# Patient Record
Sex: Male | Born: 1955 | Race: Black or African American | Hispanic: No | Marital: Single | Smoking: Current some day smoker
Health system: Southern US, Community
[De-identification: ages and names within clinical notes are randomized; demographics above are authoritative.]

## PROBLEM LIST (undated history)

## (undated) DIAGNOSIS — N19 Unspecified kidney failure: Secondary | ICD-10-CM

## (undated) DIAGNOSIS — I10 Essential (primary) hypertension: Secondary | ICD-10-CM

## (undated) DIAGNOSIS — E785 Hyperlipidemia, unspecified: Secondary | ICD-10-CM

## (undated) HISTORY — DX: Hyperlipidemia, unspecified: E78.5

## (undated) HISTORY — PX: ACHILLES TENDON REPAIR: SUR1153

## (undated) HISTORY — DX: Essential (primary) hypertension: I10

---

## 1981-08-13 DIAGNOSIS — N19 Unspecified kidney failure: Secondary | ICD-10-CM

## 1981-08-13 HISTORY — DX: Unspecified kidney failure: N19

## 1998-01-28 ENCOUNTER — Inpatient Hospital Stay (HOSPITAL_COMMUNITY): Admission: EM | Admit: 1998-01-28 | Discharge: 1998-01-29 | Payer: Self-pay | Admitting: *Deleted

## 2003-10-27 ENCOUNTER — Emergency Department (HOSPITAL_COMMUNITY): Admission: EM | Admit: 2003-10-27 | Discharge: 2003-10-27 | Payer: Self-pay | Admitting: Emergency Medicine

## 2006-02-06 ENCOUNTER — Emergency Department (HOSPITAL_COMMUNITY): Admission: EM | Admit: 2006-02-06 | Discharge: 2006-02-06 | Payer: Self-pay | Admitting: Family Medicine

## 2009-09-23 ENCOUNTER — Encounter: Admission: RE | Admit: 2009-09-23 | Discharge: 2009-09-23 | Payer: Self-pay | Admitting: Family Medicine

## 2015-01-03 ENCOUNTER — Encounter (HOSPITAL_COMMUNITY): Payer: Self-pay | Admitting: Family Medicine

## 2015-01-03 ENCOUNTER — Encounter (HOSPITAL_COMMUNITY): Payer: Self-pay | Admitting: Emergency Medicine

## 2015-01-03 ENCOUNTER — Emergency Department (HOSPITAL_COMMUNITY)
Admission: EM | Admit: 2015-01-03 | Discharge: 2015-01-03 | Payer: 59 | Source: Home / Self Care | Attending: Family Medicine | Admitting: Family Medicine

## 2015-01-03 ENCOUNTER — Emergency Department (HOSPITAL_COMMUNITY)
Admission: EM | Admit: 2015-01-03 | Discharge: 2015-01-03 | Disposition: A | Discharge status: Home / Self Care | Payer: 59 | Attending: Emergency Medicine | Admitting: Emergency Medicine

## 2015-01-03 DIAGNOSIS — R339 Retention of urine, unspecified: Secondary | ICD-10-CM | POA: Diagnosis not present

## 2015-01-03 DIAGNOSIS — R300 Dysuria: Secondary | ICD-10-CM | POA: Insufficient documentation

## 2015-01-03 DIAGNOSIS — Z720 Tobacco use: Secondary | ICD-10-CM | POA: Diagnosis not present

## 2015-01-03 DIAGNOSIS — Z87448 Personal history of other diseases of urinary system: Secondary | ICD-10-CM | POA: Insufficient documentation

## 2015-01-03 DIAGNOSIS — R1030 Lower abdominal pain, unspecified: Secondary | ICD-10-CM | POA: Diagnosis not present

## 2015-01-03 DIAGNOSIS — R338 Other retention of urine: Secondary | ICD-10-CM | POA: Diagnosis not present

## 2015-01-03 DIAGNOSIS — R000 Tachycardia, unspecified: Secondary | ICD-10-CM | POA: Insufficient documentation

## 2015-01-03 HISTORY — DX: Unspecified kidney failure: N19

## 2015-01-03 LAB — URINALYSIS, ROUTINE W REFLEX MICROSCOPIC
BILIRUBIN URINE: NEGATIVE
Glucose, UA: NEGATIVE mg/dL
Hgb urine dipstick: NEGATIVE
KETONES UR: NEGATIVE mg/dL
Nitrite: NEGATIVE
PROTEIN: NEGATIVE mg/dL
Specific Gravity, Urine: 1.018 (ref 1.005–1.030)
Urobilinogen, UA: 0.2 mg/dL (ref 0.0–1.0)
pH: 6 (ref 5.0–8.0)

## 2015-01-03 LAB — I-STAT CHEM 8, ED
BUN: 15 mg/dL (ref 6–20)
CHLORIDE: 100 mmol/L — AB (ref 101–111)
CREATININE: 1.4 mg/dL — AB (ref 0.61–1.24)
Calcium, Ion: 1.1 mmol/L — ABNORMAL LOW (ref 1.12–1.23)
GLUCOSE: 107 mg/dL — AB (ref 65–99)
HCT: 48 % (ref 39.0–52.0)
Hemoglobin: 16.3 g/dL (ref 13.0–17.0)
Potassium: 4.1 mmol/L (ref 3.5–5.1)
SODIUM: 135 mmol/L (ref 135–145)
TCO2: 19 mmol/L (ref 0–100)

## 2015-01-03 LAB — URINE MICROSCOPIC-ADD ON

## 2015-01-03 NOTE — ED Notes (Signed)
Pt here for urinary retention since this am. sts only a few drops come out.

## 2015-01-03 NOTE — ED Provider Notes (Signed)
CSN: [id number]     Arrival date & time 01/03/15  1402 History   First MD Initiated Contact with Patient 01/03/15 1418     Chief Complaint  Patient presents with  . Urinary Urgency   (Consider location/radiation/quality/duration/timing/severity/associated sxs/prior Treatment) HPI Comments: 59 year old male presents with a strong urge to urinate but the inability to produce urine. He states that he is having discomfort over the suprapubic area when he strains to urinate. This developed about midmorning today. He was able urinate normally last PM. He states his bladder feels as though it is full. Denies dysuria. He has been having bouts of sweating. No documented fever. He is not taking any medications.   Past Medical History  Diagnosis Date  . Kidney failure 1983   History reviewed. No pertinent past surgical history. History reviewed. No pertinent family history. History  Substance Use Topics  . Smoking status: Current Every Day Smoker -- 0.25 packs/day    Types: Cigarettes  . Smokeless tobacco: Not on file  . Alcohol Use: 4.2 oz/week    7 Cans of beer per week    Review of Systems  Constitutional: Positive for chills and activity change. Negative for fever.  HENT: Negative.   Respiratory: Positive for shortness of breath.   Cardiovascular: Negative.   Gastrointestinal: Positive for abdominal pain.  Genitourinary: Positive for urgency and difficulty urinating. Negative for dysuria, flank pain, discharge, penile swelling, scrotal swelling, penile pain and testicular pain.  Skin: Negative.   Neurological: Negative.     Allergies  Review of patient's allergies indicates no known allergies.  Home Medications   Prior to Admission medications   Medication Sig Start Date End Date Taking? Authorizing Provider  aspirin 81 MG tablet Take 81 mg by mouth daily.   Yes Historical Provider, MD   BP 158/81 mmHg  Pulse 60  Temp(Src) 97.1 F (36.2 C) (Oral)  Resp 16  SpO2  97% Physical Exam  Constitutional: He is oriented to person, place, and time. He appears well-developed and well-nourished. No distress.  Patient is to be in mild-to-moderate distress. He continues to hold his lower abdomen and mid suprapubic area. He is frequently going to bathroom to attempt avoiding every 3-4 minutes.  Cardiovascular: Normal rate, regular rhythm and normal heart sounds.   Pulmonary/Chest: Effort normal. No respiratory distress. He has no wheezes. He has no rales.  Abdominal:  Abdomen is soft above the umbilicus and right and left lateral abdomen. Palpation across the suprapubic area produces significant guarding and tenderness. No enlargement or protrusion suggestive of substantial bladder retention is palpable or visible but palpation produces much guarding and abdominal wall contraction.   Musculoskeletal: He exhibits no edema.  Neurological: He is alert and oriented to person, place, and time. He exhibits normal muscle tone.  Skin: Skin is warm and dry.  Psychiatric: He has a normal mood and affect.  Nursing note and vitals reviewed.   ED Course  Procedures (including critical care time) Labs Review Labs Reviewed - No data to display  Imaging Review No results found.   MDM   1. Acute suprapubic pain, unspecified laterality   2. Acute urinary retention    Transfer to the ED via shuttle for moderate to severe acute suprapubic, lower mid abdominal pain, urinary retention. He has attempted several times to obtain a urine specimen but is only able to provide a few drops.      Hayden Rasmussenavid Kumar Falwell, NP 01/03/15 1445  Hayden Rasmussenavid Jeiden Daughtridge, NP 01/03/15 1446

## 2015-01-03 NOTE — Consult Note (Signed)
Reason for Consult: Urinary Retention, Renal Insufficiency, Urethral Stricture, Prostate Screening  Referring Physician: Leonard Schwartz MD  Kenneth Velazquez is an 59 y.o. male.   HPI:   1 - Urinary Retention / Urethral Stricture - Pt with new urinary retention x 12 hours. Admits to several mos of progressive urgency / weak stream. No h/o GU trauma. ER staff with signifcant resistance noted proximal urethral and emergend Urol consult sought. 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  2 - Renal Insufficiency - Cr 1.4 noted on ER labs on eval urinary retention. Has h/o episode renal failure in 31s. No recent imagign or prior Cr baseline avail for review.   3 -  Prostate Screening - No FHX prostate cancer. No recent PSA's avail for review.   PMH sig for episode renal dysfunction in 1980s of unclear etiology. No current PCP. 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  Today "Kenneth Velazquez" is seen as emergent consult for above.    Past Medical History  Diagnosis Date  . Kidney failure 1983    History reviewed. No pertinent past surgical history.  History reviewed. No pertinent family history.  Social History:  reports that he has been smoking Cigarettes.  He has been smoking about 0.25 packs per day. He does not have any smokeless tobacco history on file. He reports that he drinks about 4.2 oz of alcohol per week. His drug history is not on file.  Allergies: No Known Allergies  Medications: I have reviewed the patient's current medications.  Results for orders placed or performed during the hospital encounter of 01/03/15 (from the past 48 hour(s))  Urinalysis, Routine w reflex microscopic     Status: Abnormal   Collection Time: 01/03/15  4:40 PM  Result Value Ref Range   Color, Urine YELLOW YELLOW   APPearance CLEAR CLEAR   Specific Gravity, Urine 1.018 1.005 - 1.030   pH 6.0 5.0 - 8.0   Glucose, UA NEGATIVE NEGATIVE mg/dL   Hgb urine dipstick  NEGATIVE NEGATIVE   Bilirubin Urine NEGATIVE NEGATIVE   Ketones, ur NEGATIVE NEGATIVE mg/dL   Protein, ur NEGATIVE NEGATIVE mg/dL   Urobilinogen, UA 0.2 0.0 - 1.0 mg/dL   Nitrite NEGATIVE NEGATIVE   Leukocytes, UA TRACE (A) NEGATIVE  Urine microscopic-add on     Status: None   Collection Time: 01/03/15  4:40 PM  Result Value Ref Range   Squamous Epithelial / LPF RARE RARE   WBC, UA 3-6 <3 WBC/hpf   RBC / HPF 0-2 <3 RBC/hpf  I-stat chem 8, ed     Status: Abnormal   Collection Time: 01/03/15  5:14 PM  Result Value Ref Range   Sodium 135 135 - 145 mmol/L   Potassium 4.1 3.5 - 5.1 mmol/L   Chloride 100 (L) 101 - 111 mmol/L   BUN 15 6 - 20 mg/dL   Creatinine, Ser 1.40 (H) 0.61 - 1.24 mg/dL   Glucose, Bld 107 (H) 65 - 99 mg/dL   Calcium, Ion 1.10 (L) 1.12 - 1.23 mmol/L   TCO2 19 0 - 100 mmol/L   Hemoglobin 16.3 13.0 - 17.0 g/dL   HCT 48.0 39.0 - 52.0 %    No results found.  Review of Systems  Constitutional: Negative.  Negative for fever, weight loss and malaise/fatigue.  HENT: Negative.   Eyes: Negative.   Respiratory: Negative.   Gastrointestinal: Negative.   Genitourinary: Positive for dysuria, urgency and frequency. Negative for hematuria and flank pain.  Musculoskeletal: Negative.   Skin: Negative.   Neurological: Negative.   Endo/Heme/Allergies: Negative.    Blood pressure 131/67, pulse 70, temperature 98.6 F (37 C), temperature source Oral, resp. rate 16, height 5' 10.5" (1.791 m), weight 75.297 kg (166 lb), SpO2 95 %. Physical Exam  Constitutional: He appears well-developed.  In ER B18 at [facility], wife at bedside. In visible discomfort.   HENT:  Head: Normocephalic.  Eyes: Pupils are equal, round, and reactive to light.  Neck: Normal range of motion.  Cardiovascular:  Mild regualr tachycardai from pain  Respiratory: Effort normal.  GI: Soft.  Bladder palpably distended and tender.   Genitourinary: Penis normal.  Musculoskeletal: Normal range of motion.   Neurological: He is alert.  Skin: Skin is warm.    BEDSIDE CYSTO / URETHRAL DILATION:  Using aseptic technique flexibel cystourethroscopy performed with 11fflexible scope. Very high-grade proximal pendulous stricutre (pinpoint noted). .Marland Kitchen38 sensor wire advanced over which 28F nephromax balloon dilation aparatus positoined using cysto guidance and inflatted to 22 atm for 90 seconds. Release of stricture then allowed inspection of prostate and bladder. Bladder trabeculated, distended, with some cloudy urine. Complete inspection of all urothelium not perforemd.  65F council catheter then palced over wire to bladder with immediate efflux 1L clear urine and resolution of SP pain. No hematuria.   Assessment/Plan:  1 - Urinary Retention / Urethral Stricture - pt in acute retention due to stricture by cysto. Dilated to approx 28F and 65F foley placed. Plan to keep x approx 10 days then office trial of void. Expalined there is approx 50% risk of recurrence with dilation alone.   2 - Renal Insufficiency - Will need BMP on return to establish true baseline after retention resolved.  3 -  Prostate Screening - Will need PSA / DRE after retention resolved and catheter free as would be artifically abnormal at this time.  4 - OK for DC with foley, we will arrange for GU f/u/ with trial of void. This was outlined to pt and wife who voiced understanding.   Kenneth Velazquez   01/03/2015, 7:06 PM

## 2015-01-03 NOTE — ED Notes (Signed)
Pt will be taken to the ED via our shuttle for Urinary retention and urgency. Pt is stable at this time.  Report was called to the First Nurse Danielle.

## 2015-01-03 NOTE — ED Notes (Signed)
Attempted to place 16 french foley-- without success-- catheter would not pass. Dr. Radford PaxBeaton made aware.

## 2015-01-03 NOTE — Discharge Instructions (Signed)
Acute Urinary Retention °Acute urinary retention is the temporary inability to urinate. °This is a common problem in older men. As men age their prostates become larger and block the flow of urine from the bladder. This is usually a problem that has come on gradually.  °HOME CARE INSTRUCTIONS °If you are sent home with a Foley catheter and a drainage system, you will need to discuss the best course of action with your health care provider. While the catheter is in, maintain a good intake of fluids. Keep the drainage bag emptied and lower than your catheter. This is so that contaminated urine will not flow back into your bladder, which could lead to a urinary tract infection. °There are two main types of drainage bags. One is a large bag that usually is used at night. It has a good capacity that will allow you to sleep through the night without having to empty it. The second type is called a leg bag. It has a smaller capacity, so it needs to be emptied more frequently. However, the main advantage is that it can be attached by a leg strap and can go underneath your clothing, allowing you the freedom to move about or leave your home. °Only take over-the-counter or prescription medicines for pain, discomfort, or fever as directed by your health care provider.  °SEEK MEDICAL CARE IF: °· You develop a low-grade fever. °· You experience spasms or leakage of urine with the spasms. °SEEK IMMEDIATE MEDICAL CARE IF:  °1. You develop chills or fever. °2. Your catheter stops draining urine. °3. Your catheter falls out. °4. You start to develop increased bleeding that does not respond to rest and increased fluid intake. °MAKE SURE YOU: °1. Understand these instructions. °2. Will watch your condition. °3. Will get help right away if you are not doing well or get worse. °Document Released: 11/05/2000 Document Revised: 08/04/2013 Document Reviewed: 01/08/2013 °ExitCare® Patient Information ©2015 [employer]. This information is  not intended to replace advice given to you by your health care provider. Make sure you discuss any questions you have with your health care provider. °Foley Catheter Care °A Foley catheter is a soft, flexible tube that is placed into the bladder to drain urine. A Foley catheter may be inserted if: °· You leak urine or are not able to control when you urinate (urinary incontinence). °· You are not able to urinate when you need to (urinary retention). °· You had prostate surgery or surgery on the genitals. °· You have certain medical conditions, such as multiple sclerosis, dementia, or a spinal cord injury. °If you are going home with a Foley catheter in place, follow the instructions below. °TAKING CARE OF THE CATHETER °5. Wash your hands with soap and water. °6. Using mild soap and warm water on a clean washcloth: °· Clean the area on your body closest to the catheter insertion site using a circular motion, moving away from the catheter. Never wipe toward the catheter because this could sweep bacteria up into the urethra and cause infection. °· Remove all traces of soap. Pat the area dry with a clean towel. For males, reposition the foreskin. °7. Attach the catheter to your leg so there is no tension on the catheter. Use adhesive tape or a leg strap. If you are using adhesive tape, remove any sticky residue left behind by the previous tape you used. °8. Keep the drainage bag below the level of the bladder, but keep it off the floor. °9. Check throughout   the day to be sure the catheter is working and urine is draining freely. Make sure the tubing does not become kinked. °10. Do not pull on the catheter or try to remove it. Pulling could damage internal tissues. °TAKING CARE OF THE DRAINAGE BAGS °You will be given two drainage bags to take home. One is a large overnight drainage bag, and the other is a smaller leg bag that fits underneath clothing. You may wear the overnight bag at any time, but you should never wear  the smaller leg bag at night. Follow the instructions below for how to empty, change, and clean your drainage bags. °Emptying the Drainage Bag °You must empty your drainage bag when it is  -½ full or at least 2-3 times a day. °4. Wash your hands with soap and water. °5. Keep the drainage bag below your hips, below the level of your bladder. This stops urine from going back into the tubing and into your bladder. °6. Hold the dirty bag over the toilet or a clean container. °7. Open the pour spout at the bottom of the bag and empty the urine into the toilet or container. Do not let the pour spout touch the toilet, container, or any other surface. Doing so can place bacteria on the bag, which can cause an infection. °8. Clean the pour spout with a gauze pad or cotton ball that has rubbing alcohol on it. °9. Close the pour spout. °10. Attach the bag to your leg with adhesive tape or a leg strap. °11. Wash your hands well. °Changing the Drainage Bag °Change your drainage bag once a month or sooner if it starts to smell bad or look dirty. Below are steps to follow when changing the drainage bag. °1. Wash your hands with soap and water. °2. Pinch off the rubber catheter so that urine does not spill out. °3. Disconnect the catheter tube from the drainage tube at the connection valve. Do not let the tubes touch any surface. °4. Clean the end of the catheter tube with an alcohol wipe. Use a different alcohol wipe to clean the end of the drainage tube. °5. Connect the catheter tube to the drainage tube of the clean drainage bag. °6. Attach the new bag to the leg with adhesive tape or a leg strap. Avoid attaching the new bag too tightly. °7. Wash your hands well. °Cleaning the Drainage Bag °1. Wash your hands with soap and water. °2. Wash the bag in warm, soapy water. °3. Rinse the bag thoroughly with warm water. °4. Fill the bag with a solution of white vinegar and water (1 cup vinegar to 1 qt warm water [.2 L vinegar to 1 L  warm water]). Close the bag and soak it for 30 minutes in the solution. °5. Rinse the bag with warm water. °6. Hang the bag to dry with the pour spout open and hanging downward. °7. Store the clean bag (once it is dry) in a clean plastic bag. °8. Wash your hands well. °PREVENTING INFECTION °· Wash your hands before and after handling your catheter. °· Take showers daily and wash the area where the catheter enters your body. Do not take baths. Replace wet leg straps with dry ones, if this applies. °· Do not use powders, sprays, or lotions on the genital area. Only use creams, lotions, or ointments as directed by your caregiver. °· For females, wipe from front to back after each bowel movement. °· Drink enough fluids to keep your urine   clear or pale yellow unless you have a fluid restriction. °· Do not let the drainage bag or tubing touch or lie on the floor. °· Wear cotton underwear to absorb moisture and to keep your skin drier. °SEEK MEDICAL CARE IF:  °· Your urine is cloudy or smells unusually bad. °· Your catheter becomes clogged. °· You are not draining urine into the bag or your bladder feels full. °· Your catheter starts to leak. °SEEK IMMEDIATE MEDICAL CARE IF:  °· You have pain, swelling, redness, or pus where the catheter enters the body. °· You have pain in the abdomen, legs, lower back, or bladder. °· You have a fever. °· You see blood fill the catheter, or your urine is pink or red. °· You have nausea, vomiting, or chills. °· Your catheter gets pulled out. °MAKE SURE YOU:  °· Understand these instructions. °· Will watch your condition. °· Will get help right away if you are not doing well or get worse. °Document Released: 07/30/2005 Document Revised: 12/14/2013 Document Reviewed: 07/21/2012 °ExitCare® Patient Information ©2015 [employer]. This information is not intended to replace advice given to you by your health care provider. Make sure you discuss any questions you have with your health care  provider. ° °

## 2015-01-03 NOTE — ED Notes (Signed)
Pt woke up this morning with urgency to urinate but is unable to urinate.  He has pain as well.

## 2015-01-04 LAB — URINE CULTURE
COLONY COUNT: NO GROWTH
Culture: NO GROWTH
Special Requests: NORMAL

## 2015-01-19 NOTE — ED Provider Notes (Signed)
CSN: [id number]     Arrival date & time 01/03/15  1501 History   First MD Initiated Contact with Patient 01/03/15 1554     Chief Complaint  Patient presents with  . Urinary Retention      HPI Patient presents with 24-48 hour history of inability to urinate normally.  No fever no chills.  Has been having to strain to urinate.  His urinary out put in stream has been decreasing over the last several weeks.  Otherwise patient is taking no medication and has no significant past medical history. Past Medical History  Diagnosis Date  . Kidney failure 1983   History reviewed. No pertinent past surgical history. History reviewed. No pertinent family history. History  Substance Use Topics  . Smoking status: Current Every Day Smoker -- 0.25 packs/day    Types: Cigarettes  . Smokeless tobacco: Not on file  . Alcohol Use: 4.2 oz/week    7 Cans of beer per week    Review of Systems  All other systems reviewed and are negative  Allergies  Review of patient's allergies indicates no known allergies.  Home Medications   Prior to Admission medications   Medication Sig Start Date End Date Taking? Authorizing Provider  aspirin 81 MG tablet Take 81 mg by mouth daily.   Yes Historical Provider, MD   BP 128/75 mmHg  Pulse 74  Temp(Src) 98.6 F (37 C) (Oral)  Resp 16  Ht 5' 10.5" (1.791 m)  Wt 166 lb (75.297 kg)  BMI 23.47 kg/m2  SpO2 95% Physical Exam  Constitutional: He is oriented to person, place, and time. He appears well-developed and well-nourished. No distress.  HENT:  Head: Normocephalic and atraumatic.  Eyes: Pupils are equal, round, and reactive to light.  Neck: Normal range of motion.  Cardiovascular: Normal rate and intact distal pulses.   Pulmonary/Chest: No respiratory distress.  Abdominal: Normal appearance. He exhibits distension. There is tenderness.    Musculoskeletal: Normal range of motion.  Neurological: He is alert and oriented to person, place, and time. No  cranial nerve deficit.  Skin: Skin is warm and dry. No rash noted.  Psychiatric: He has a normal mood and affect. His behavior is normal.  Nursing note and vitals reviewed.   ED Course  BLADDER CATHETERIZATION Date/Time: 01/03/2015 6:00 PM Performed by: Nelva Nay Authorized by: Nelva Nay Consent: Verbal consent obtained. Risks and benefits: risks, benefits and alternatives were discussed Consent given by: patient Patient identity confirmed: verbally with patient and arm band Indications: urinary retention Local anesthesia used: no Patient sedated: no Preparation: Patient was prepped and draped in the usual sterile fashion. Catheter type: Foley Catheter size: 16 Fr Complicated insertion: yes (After several attempts there was no urine return and was unable to pass a catheter) Number of attempts: 3 Urine volume: 0 ml Comments: Will consult urology   (including critical care time)   Labs Review Labs Reviewed  URINALYSIS, ROUTINE W REFLEX MICROSCOPIC - Abnormal; Notable for the following:    Leukocytes, UA TRACE (*)    All other components within normal limits  I-STAT CHEM 8, ED - Abnormal; Notable for the following:    Chloride 100 (*)    Creatinine, Ser 1.40 (*)    Glucose, Bld 107 (*)    Calcium, Ion 1.10 (*)    All other components within normal limits  URINE CULTURE  URINE MICROSCOPIC-ADD ON    Imaging Review No results found.   EKG Interpretation None     Urology  was consulted and came to the emergency department to place catheter.  Catheter was successfully placed and urology arrange for follow-up. MDM   Final diagnoses:  Urinary retention        Nelva Nayobert Swayzie Choate, MD 01/19/15 1622

## 2020-05-04 ENCOUNTER — Other Ambulatory Visit: Payer: Self-pay | Admitting: Family Medicine

## 2020-05-04 ENCOUNTER — Ambulatory Visit
Admission: RE | Admit: 2020-05-04 | Discharge: 2020-05-04 | Disposition: A | Discharge status: Home / Self Care | Payer: 59 | Source: Ambulatory Visit | Attending: Family Medicine | Admitting: Family Medicine

## 2020-05-04 ENCOUNTER — Other Ambulatory Visit: Payer: Self-pay

## 2020-05-04 DIAGNOSIS — R52 Pain, unspecified: Secondary | ICD-10-CM

## 2021-03-23 ENCOUNTER — Other Ambulatory Visit: Payer: Self-pay | Admitting: Family Medicine

## 2021-03-23 DIAGNOSIS — R519 Headache, unspecified: Secondary | ICD-10-CM

## 2021-03-24 ENCOUNTER — Ambulatory Visit
Admission: RE | Admit: 2021-03-24 | Discharge: 2021-03-24 | Disposition: A | Discharge status: Home / Self Care | Payer: 59 | Source: Ambulatory Visit | Attending: Family Medicine | Admitting: Family Medicine

## 2021-03-24 DIAGNOSIS — R519 Headache, unspecified: Secondary | ICD-10-CM

## 2022-02-01 ENCOUNTER — Other Ambulatory Visit: Payer: Self-pay | Admitting: Cardiology

## 2022-02-01 ENCOUNTER — Encounter: Payer: Self-pay | Admitting: Cardiology

## 2022-02-01 ENCOUNTER — Telehealth: Payer: Self-pay | Admitting: Cardiology

## 2022-02-01 ENCOUNTER — Ambulatory Visit: Payer: 59 | Admitting: Cardiology

## 2022-02-01 VITALS — BP 161/90 | HR 55 | Temp 98.5°F | Resp 16 | Ht 70.0 in | Wt 174.2 lb

## 2022-02-01 DIAGNOSIS — R072 Precordial pain: Secondary | ICD-10-CM

## 2022-02-01 DIAGNOSIS — E7800 Pure hypercholesterolemia, unspecified: Secondary | ICD-10-CM

## 2022-02-01 DIAGNOSIS — I10 Essential (primary) hypertension: Secondary | ICD-10-CM

## 2022-02-01 MED ORDER — HYDROCHLOROTHIAZIDE 25 MG PO TABS: 25.0000 mg | ORAL_TABLET | Freq: Every morning | ORAL | 0 refills | Status: AC

## 2022-02-01 NOTE — Telephone Encounter (Signed)
Patient instructed at checkout to make an appointment at [facility]. Says they don't have anything available until August. Patient wanted to verify this is ok, and can we move his follow up to after that appointment?

## 2022-02-01 NOTE — Progress Notes (Signed)
ID:  Kenneth Velazquez, DOB July 21, 1956, MRN [id number]  PCP:  Patient, No Pcp Per  Cardiologist:  Tessa Lerner, DO, [facility] (established care 02/01/2022)  REASON FOR CONSULT: Chest pain  REQUESTING PHYSICIAN:  Mirna Mires, MD [address] [place],  [place] [address]  Chief Complaint  Patient presents with   Chest Pain   New Patient (Initial Visit)    HPI  Kenneth Velazquez is a 66 y.o. African-American male who presents to the clinic for evaluation of chest pain at the request of Mirna Mires, MD. His past medical history and cardiovascular risk factors include: Cigarette smoker, hyperlipidemia.  Chest pain: Patient had a cardial discomfort about 2.5 weeks ago, located anteriorly on both right and left side, intensity 8 out of 10, nonradiating, the discomfort lasted all day until he went to sleep.  He followed up with his PCP and now referred to cardiology for further evaluation.  The discomfort was nonexertional, and did not resolve with rest.  Patient stated that when he does physical activity the chest pain was actually getting better but more noticeable when he was sitting at his desk at work.  He was given sublingual nitroglycerin tablets but has not used them.   Patient intermittently gets headaches currently asymptomatic.  Patient does not check his blood pressures at home.  Patient states that he does have some anterior precordial discomfort, shortness of breath, and complains of PND.  Started blood pressure pills about 2 weeks ago and statin therapy less than a week ago.  No family history of premature coronary artery disease, sudden cardiac death, or cardiomyopathy.  Patient is a longtime smoker but is in the process of quitting, currently smokes 3 cigarettes/day.  FUNCTIONAL STATUS: Plays golf 2-3 x per week.    ALLERGIES: No Known Allergies  MEDICATION LIST PRIOR TO VISIT: Current Meds  Medication Sig   amLODipine (NORVASC) 5 MG tablet Take 5 mg by mouth daily.    aspirin 81 MG tablet Take 81 mg by mouth daily.   hydrochlorothiazide (HYDRODIURIL) 25 MG tablet Take 1 tablet (25 mg total) by mouth every morning.   nitroGLYCERIN (NITROSTAT) 0.4 MG SL tablet Place under the tongue.   rosuvastatin (CRESTOR) 10 MG tablet Take 10 mg by mouth at bedtime.     PAST MEDICAL HISTORY: Past Medical History:  Diagnosis Date   Hyperlipidemia    Hypertension    Kidney failure 08/13/1981    PAST SURGICAL HISTORY: Past Surgical History:  Procedure Laterality Date   ACHILLES TENDON REPAIR      FAMILY HISTORY: The patient family history includes Cancer in his father and mother; Hypertension in his mother.  SOCIAL HISTORY:  The patient  reports that he has been smoking cigarettes. He has been smoking an average of .25 packs per day. He does not have any smokeless tobacco history on file. He reports current alcohol use of about 7.0 standard drinks of alcohol per week. He reports that he does not use drugs.  REVIEW OF SYSTEMS: Review of Systems  Cardiovascular:  Positive for chest pain and paroxysmal nocturnal dyspnea. Negative for dyspnea on exertion, leg swelling, orthopnea, palpitations and syncope.  Respiratory:  Positive for shortness of breath.     PHYSICAL EXAM:    02/01/2022    9:06 AM 02/01/2022    9:05 AM 01/03/2015    7:45 PM  Vitals with BMI  Height  5\' 10"    Weight  174 lbs 3 oz   BMI  25   Systolic 161 159 196  Diastolic 90 91 75  Pulse 55 55 74    CONSTITUTIONAL: Well-developed and well-nourished. No acute distress.  SKIN: Skin is warm and dry. No rash noted. No cyanosis. No pallor. No jaundice HEAD: Normocephalic and atraumatic.  EYES: No scleral icterus MOUTH/THROAT: Moist oral membranes.  NECK: No JVD present. No thyromegaly noted. No carotid bruits  CHEST Normal respiratory effort. No intercostal retractions  LUNGS: Clear to auscultation bilaterally.  No stridor. No wheezes. No rales.  CARDIOVASCULAR: Regular rate and rhythm,  positive S1-S2, no murmurs rubs or gallops appreciated. ABDOMINAL: Soft, nontender, nondistended, positive bowel sounds in all 4 quadrants, no apparent ascites.  EXTREMITIES: No peripheral edema, warm to touch, 2+ bilateral DP and PT pulses HEMATOLOGIC: No significant bruising NEUROLOGIC: Oriented to person, place, and time. Nonfocal. Normal muscle tone.  PSYCHIATRIC: Normal mood and affect. Normal behavior. Cooperative  CARDIAC DATABASE: EKG: 02/01/2022: 56 bpm, sinus bradycardia, normal axis, without underlying ischemia or injury pattern.  Echocardiogram: No results found for this or any previous visit from the past 1095 days.    Stress Testing: No results found for this or any previous visit from the past 1095 days.   Heart Catheterization: None  LABORATORY DATA:    Latest Ref Rng & Units 01/03/2015    5:14 PM  CBC  Hemoglobin 13.0 - 17.0 g/dL 22.2   Hematocrit 97.9 - 52.0 % 48.0        Latest Ref Rng & Units 01/03/2015    5:14 PM  CMP  Glucose 65 - 99 mg/dL 892   BUN 6 - 20 mg/dL 15   Creatinine 1.19 - 1.24 mg/dL 4.17   Sodium 408 - 144 mmol/L 135   Potassium 3.5 - 5.1 mmol/L 4.1   Chloride 101 - 111 mmol/L 100     Lipid Panel  No results found for: "CHOL", "TRIG", "HDL", "CHOLHDL", "VLDL", "LDLCALC", "LDLDIRECT", "LABVLDL"  No components found for: "NTPROBNP" No results for input(s): "PROBNP" in the last 8760 hours. No results for input(s): "TSH" in the last 8760 hours.  BMP No results for input(s): "NA", "K", "CL", "CO2", "GLUCOSE", "BUN", "CREATININE", "CALCIUM", "GFRNONAA", "GFRAA" in the last 8760 hours.  HEMOGLOBIN A1C No results found for: "HGBA1C", "MPG"  External Labs: Collected: 01/15/2022 Total cholesterol 243, HDL 69, triglycerides 196, LDL 140, non-HDL 174 BUN 15, creatinine 1.17. Sodium 137, potassium 4.4, chloride 104, bicarb 25, AST 15, ALT 14, alkaline phosphatase 44  IMPRESSION:    ICD-10-CM   1. Precordial pain  R07.2 EKG 12-Lead     PCV ECHOCARDIOGRAM COMPLETE    CT CARDIAC SCORING ([facility] LOCATIONS ONLY)    PCV CARDIAC STRESS TEST    2. Pure hypercholesterolemia  E78.00     3. Benign hypertension  I10 hydrochlorothiazide (HYDRODIURIL) 25 MG tablet    Basic metabolic panel    Magnesium       RECOMMENDATIONS: Kenneth Velazquez is a 66 y.o. African-American male whose past medical history and cardiac risk factors include: Cigarette smoker, hyperlipidemia.  Precordial pain Symptoms suggestive of noncardiac etiology. Given his risk factors recommend coronary artery calcification score, echocardiogram, and exercise treadmill stress test Educated on seeking medical attention sooner by going to the closest ER via EMS if the symptoms increase in intensity, frequency, duration, or has typical chest pain as discussed in the office.  Patient verbalized understanding.  Pure hypercholesterolemia Most recent LDL 140 mg/dL. Started on Crestor 10 mg p.o. nightly recently by his PCP. Recommend  follow-up blood work in 6 weeks to evaluate lipid profile and LFTs.  Will defer to PCP  Benign hypertension Office blood pressure not well controlled. Started on antihypertensive medications approximately 2 weeks ago. We will start hydrochlorothiazide 25 mg p.o. every morning as the patient does not have a PCP appointment in the near future and his blood pressures are too high.  Reemphasized the importance of a low-salt diet. Labs in 1 week to evaluate kidney function and electrolytes. We will defer long-term management of blood pressure to PCP.  As part of this consultation reviewed outside records provided by referring physician including office note, EKG, labs.  These were independently reviewed and summarized above.  Today's EKG ordered and independently reviewed as noted above.  And ordered additional diagnostic work-up as well as pharmacological therapy for reasons mentioned above  FINAL MEDICATION LIST END OF ENCOUNTER: Meds ordered  this encounter  Medications   hydrochlorothiazide (HYDRODIURIL) 25 MG tablet    Sig: Take 1 tablet (25 mg total) by mouth every morning.    Dispense:  90 tablet    Refill:  0    There are no discontinued medications.   Current Outpatient Medications:    amLODipine (NORVASC) 5 MG tablet, Take 5 mg by mouth daily., Disp: , Rfl:    aspirin 81 MG tablet, Take 81 mg by mouth daily., Disp: , Rfl:    hydrochlorothiazide (HYDRODIURIL) 25 MG tablet, Take 1 tablet (25 mg total) by mouth every morning., Disp: 90 tablet, Rfl: 0   nitroGLYCERIN (NITROSTAT) 0.4 MG SL tablet, Place under the tongue., Disp: , Rfl:    rosuvastatin (CRESTOR) 10 MG tablet, Take 10 mg by mouth at bedtime., Disp: , Rfl:   Orders Placed This Encounter  Procedures   CT CARDIAC SCORING ([facility] LOCATIONS ONLY)   Basic metabolic panel   Magnesium   PCV CARDIAC STRESS TEST   EKG 12-Lead   PCV ECHOCARDIOGRAM COMPLETE    There are no Patient Instructions on file for this visit.   --Continue cardiac medications as reconciled in final medication list. --Return in about 4 weeks (around 03/01/2022) for Reevaluation of, Chest pain, Review test results. or sooner if needed. --Continue follow-up with your primary care physician regarding the management of your other chronic comorbid conditions.  Patient's questions and concerns were addressed to his satisfaction. He voices understanding of the instructions provided during this encounter.   This note was created using a voice recognition software as a result there may be grammatical errors inadvertently enclosed that do not reflect the nature of this encounter. Every attempt is made to correct such errors.  Tessa Lerner, [place], [facility]  Pager: [phone] Office: [phone]

## 2022-02-01 NOTE — Telephone Encounter (Signed)
Its a calcium score can it be done elsewhere?  ST

## 2022-02-02 ENCOUNTER — Ambulatory Visit: Payer: 59

## 2022-02-02 DIAGNOSIS — R072 Precordial pain: Secondary | ICD-10-CM

## 2022-02-02 LAB — BASIC METABOLIC PANEL
BUN/Creatinine Ratio: 10 (ref 10–24)
BUN: 14 mg/dL (ref 8–27)
CO2: 24 mmol/L (ref 20–29)
Calcium: 9.2 mg/dL (ref 8.6–10.2)
Chloride: 103 mmol/L (ref 96–106)
Creatinine, Ser: 1.34 mg/dL — ABNORMAL HIGH (ref 0.76–1.27)
Glucose: 94 mg/dL (ref 70–99)
Potassium: 4.4 mmol/L (ref 3.5–5.2)
Sodium: 138 mmol/L (ref 134–144)
eGFR: 59 mL/min/{1.73_m2} — ABNORMAL LOW (ref >59)

## 2022-02-02 LAB — MAGNESIUM: Magnesium: 2 mg/dL (ref 1.6–2.3)

## 2022-02-07 ENCOUNTER — Other Ambulatory Visit: Payer: Self-pay

## 2022-02-08 ENCOUNTER — Other Ambulatory Visit: Payer: Self-pay

## 2022-02-08 DIAGNOSIS — I10 Essential (primary) hypertension: Secondary | ICD-10-CM

## 2022-02-08 DIAGNOSIS — R072 Precordial pain: Secondary | ICD-10-CM

## 2022-02-08 NOTE — Progress Notes (Signed)
Called and spoke to patient he voiced understanding is going to get them done next week

## 2022-02-08 NOTE — Progress Notes (Signed)
Called patient, NA, LMAM

## 2022-02-09 NOTE — Telephone Encounter (Signed)
Has patient been scheduled for his test?

## 2022-02-12 NOTE — Telephone Encounter (Signed)
See message above

## 2022-02-14 NOTE — Progress Notes (Signed)
Called, NA, wasn't able to leave a VM.

## 2022-02-15 LAB — BASIC METABOLIC PANEL
BUN/Creatinine Ratio: 16 (ref 10–24)
BUN: 22 mg/dL (ref 8–27)
CO2: 22 mmol/L (ref 20–29)
Calcium: 9.4 mg/dL (ref 8.6–10.2)
Chloride: 101 mmol/L (ref 96–106)
Creatinine, Ser: 1.38 mg/dL — ABNORMAL HIGH (ref 0.76–1.27)
Glucose: 104 mg/dL — ABNORMAL HIGH (ref 70–99)
Potassium: 4.2 mmol/L (ref 3.5–5.2)
Sodium: 139 mmol/L (ref 134–144)
eGFR: 57 mL/min/{1.73_m2} — ABNORMAL LOW (ref >59)

## 2022-02-15 LAB — MAGNESIUM: Magnesium: 2.7 mg/dL — ABNORMAL HIGH (ref 1.6–2.3)

## 2022-02-15 NOTE — Telephone Encounter (Signed)
That's fine. Please confirm that its available in careeverywhere.   Dr. Odis Hollingshead

## 2022-02-16 NOTE — Progress Notes (Signed)
Called and spoke with patient regarding his stress test results. He will do his best to log his BP's because he does not have a machine, he will rely on going to any pharmacy to check his BP.

## 2022-02-16 NOTE — Progress Notes (Signed)
Called pt, NA, VM was full.

## 2022-02-19 NOTE — Progress Notes (Signed)
Called pt, wasn't able to leave a VM.

## 2022-02-20 ENCOUNTER — Ambulatory Visit: Payer: 59

## 2022-02-20 DIAGNOSIS — R072 Precordial pain: Secondary | ICD-10-CM

## 2022-02-21 NOTE — Progress Notes (Signed)
Patient is aware and will bring all medications to his next OV.

## 2022-02-26 NOTE — Progress Notes (Signed)
Called pt and he is aware of echo results.

## 2022-03-01 ENCOUNTER — Encounter: Payer: Self-pay | Admitting: Cardiology

## 2022-03-01 ENCOUNTER — Ambulatory Visit: Payer: 59 | Admitting: Cardiology

## 2022-03-01 VITALS — BP 148/76 | HR 57 | Temp 98.2°F | Resp 16 | Ht 70.0 in | Wt 174.8 lb

## 2022-03-01 DIAGNOSIS — I10 Essential (primary) hypertension: Secondary | ICD-10-CM

## 2022-03-01 DIAGNOSIS — E7800 Pure hypercholesterolemia, unspecified: Secondary | ICD-10-CM

## 2022-03-01 DIAGNOSIS — R0602 Shortness of breath: Secondary | ICD-10-CM

## 2022-03-01 DIAGNOSIS — R072 Precordial pain: Secondary | ICD-10-CM

## 2022-03-01 MED ORDER — AMLODIPINE BESYLATE 10 MG PO TABS: 10.0000 mg | ORAL_TABLET | Freq: Every day | ORAL | 1 refills | Status: AC

## 2022-03-01 NOTE — Progress Notes (Signed)
ID:  Kenneth Velazquez, DOB 10-Aug-1956, MRN [id number]  PCP:  Parke Simmers, Clinic  Cardiologist:  Tessa Lerner, DO, [facility] (established care 02/01/2022)  Date: 03/01/22 Last Office Visit: 6/22/2023se  Chief Complaint  Patient presents with   Chest Pain   Results   Follow-up    4 weeks    HPI  Kenneth Velazquez is a 66 y.o. African-American male whose  past medical history and cardiovascular risk factors include: Cigarette smoker, hyperlipidemia.  Patient initially presented to the office for evaluation of chest pain.  Based on symptomatology predominantly noncardiac but given his risk factors patient underwent a coronary calcium score, GXT, and echocardiogram.  Results reviewed with him in great detail and noted below for further reference.  Clinically he no longer is experiencing precordial pain.  He was started on antihypertensive medications at the last office visit and his home blood pressures have improved significantly.  SBP range between 130-145 mmHg and DBP between 80-90 mmHg and pulse 55-70 bpm.   Patient is also making a conscious effort with regards to complete smoking cessation.  At the last visit he was smoking 3 cigarettes/day and now down to 1-2 cigarettes/day.  He is congratulated on his efforts.  FUNCTIONAL STATUS: Plays golf 2-3 x per week.    ALLERGIES: No Known Allergies  MEDICATION LIST PRIOR TO VISIT: Current Meds  Medication Sig   amLODipine (NORVASC) 10 MG tablet Take 1 tablet (10 mg total) by mouth daily at 10 pm.   aspirin 81 MG tablet Take 81 mg by mouth daily.   hydrochlorothiazide (HYDRODIURIL) 25 MG tablet Take 1 tablet (25 mg total) by mouth every morning.   nitroGLYCERIN (NITROSTAT) 0.4 MG SL tablet Place under the tongue.   rosuvastatin (CRESTOR) 10 MG tablet Take 10 mg by mouth at bedtime.   [DISCONTINUED] amLODipine (NORVASC) 5 MG tablet Take 5 mg by mouth daily.     PAST MEDICAL HISTORY: Past Medical History:  Diagnosis Date   Hyperlipidemia     Hypertension    Kidney failure 08/13/1981    PAST SURGICAL HISTORY: Past Surgical History:  Procedure Laterality Date   ACHILLES TENDON REPAIR      FAMILY HISTORY: The patient family history includes Cancer in his father and mother; Hypertension in his mother.  SOCIAL HISTORY:  The patient  reports that he has been smoking cigarettes. He has a 11.25 pack-year smoking history. He does not have any smokeless tobacco history on file. He reports current alcohol use of about 7.0 standard drinks of alcohol per week. He reports that he does not use drugs.  REVIEW OF SYSTEMS: Review of Systems  Cardiovascular:  Positive for dyspnea on exertion and paroxysmal nocturnal dyspnea. Negative for chest pain, leg swelling, orthopnea, palpitations and syncope.  Respiratory:  Positive for shortness of breath.    PHYSICAL EXAM:    03/01/2022    9:05 AM 02/01/2022    9:06 AM 02/01/2022    9:05 AM  Vitals with BMI  Height 5\' 10"   5\' 10"   Weight 174 lbs 13 oz  174 lbs 3 oz  BMI 25.08  25  Systolic 148 161 408  Diastolic 76 90 91  Pulse 57 55 55    CONSTITUTIONAL: Well-developed and well-nourished. No acute distress.  SKIN: Skin is warm and dry. No rash noted. No cyanosis. No pallor. No jaundice HEAD: Normocephalic and atraumatic.  EYES: No scleral icterus MOUTH/THROAT: Moist oral membranes.  NECK: No JVD present. No thyromegaly noted. No carotid bruits  CHEST Normal respiratory effort. No intercostal retractions  LUNGS: Clear to auscultation bilaterally.  No stridor. No wheezes. No rales.  CARDIOVASCULAR: Regular rate and rhythm, positive S1-S2, no murmurs rubs or gallops appreciated. ABDOMINAL: Soft, nontender, nondistended, positive bowel sounds in all 4 quadrants, no apparent ascites.  EXTREMITIES: No peripheral edema, warm to touch, 2+ bilateral DP and PT pulses HEMATOLOGIC: No significant bruising NEUROLOGIC: Oriented to person, place, and time. Nonfocal. Normal muscle tone.   PSYCHIATRIC: Normal mood and affect. Normal behavior. Cooperative  CARDIAC DATABASE: EKG: 02/01/2022: 56 bpm, sinus bradycardia, normal axis, without underlying ischemia or injury pattern.  Echocardiogram: 02/20/2022: Normal LV systolic function with visual EF 60-65%. Left ventricle cavity is normal in size. Normal left ventricular wall thickness. Normal global wall motion. Normal diastolic filling pattern, normal LAP.  No significant valvular heart disease. No prior study for comparison.   Stress Testing: Exercise treadmill stress test 02/02/2022: Exercise treadmill stress test performed using Bruce protocol. Patient reached 9.4 METS, and 87% of age predicted maximum heart rate. Exercise capacity was good. No chest pain reported. Normal heart rate response. Hypertensive exercise response, peak BP 220/80 mmHg.  Stress EKG revealed no ischemic changes. Low risk study.  Heart Catheterization: None  CT Cardiac Scoring: Performed at [facility] 02/06/2022. Total CAC 0 AU. Noncardiac findings: 1.9 x 1.3 cm hepatic cyst  LABORATORY DATA:    Latest Ref Rng & Units 01/03/2015    5:14 PM  CBC  Hemoglobin 13.0 - 17.0 g/dL 63.7   Hematocrit 85.8 - 52.0 % 48.0        Latest Ref Rng & Units 02/14/2022   11:23 AM 02/01/2022   12:13 PM 01/03/2015    5:14 PM  CMP  Glucose 70 - 99 mg/dL 850  94  277   BUN 8 - 27 mg/dL 22  14  15    Creatinine 0.76 - 1.27 mg/dL  4.12  8.78   Sodium 134 - 144 mmol/L 139  138  135   Potassium 3.5 - 5.2 mmol/L 4.2  4.4  4.1   Chloride 96 - 106 mmol/L 101  103  100   CO2 20 - 29 mmol/L 22  24    Calcium 8.6 - 10.2 mg/dL 9.4  9.2      Lipid Panel  No results found for: "CHOL", "TRIG", "HDL", "CHOLHDL", "VLDL", "LDLCALC", "LDLDIRECT", "LABVLDL"  No components found for: "NTPROBNP" No results for input(s): "PROBNP" in the last 8760 hours. No results for input(s): "TSH" in the last 8760 hours.  BMP Recent Labs    02/01/22 1213 02/14/22 1123  NA  138 139  K 4.4 4.2  CL 103 101  CO2 24 22  GLUCOSE 94 104*  BUN 14 22  CREATININE 1.34* 1.38*  CALCIUM 9.2 9.4    HEMOGLOBIN A1C No results found for: "HGBA1C", "MPG"  External Labs: Collected: 01/15/2022 Total cholesterol 243, HDL 69, triglycerides 196, LDL 140, non-HDL 174 BUN 15, creatinine 1.17. Sodium 137, potassium 4.4, chloride 104, bicarb 25, AST 15, ALT 14, alkaline phosphatase 44  IMPRESSION:    ICD-10-CM   1. Precordial pain  R07.2     2. Benign hypertension  I10 amLODipine (NORVASC) 10 MG tablet    3. Pure hypercholesterolemia  E78.00     4. Shortness of breath  R06.02        RECOMMENDATIONS: KENDRIK MCSHAN is a 66 y.o. African-American male whose past medical history and cardiac risk factors include: Cigarette smoker, hyperlipidemia.  Precordial pain  Precordial pain resolved. Echocardiogram: Preserved LVEF, normal diastolic function, no significant valvular heart disease. GXT: Low risk study except hypertensive response to exercise. Coronary calcium score: 0 Educated on importance of improving his modifiable cardiovascular risk factors such as blood pressure management, lipid management and complete cessation of nicotine.  Benign hypertension Improved. Initially SBP would be around 160 mmHg or greater. Based on a home blood pressure log SBP ranges between 130-145 mmHg. Increase amlodipine to 10 mg p.o. daily. Further medication titration will be deferred to PCP. We emphasized the importance of a low-salt diet  Pure hypercholesterolemia Currently on Crestor 10 mg p.o. nightly. He has been on statin medications for more than 6 weeks.  Recommend repeating fasting lipid profile.  Patient will follow-up with PCP  Shortness of breath Would like to focus on better blood pressure management for now. However if the symptoms continue despite adequate blood pressure would recommend pulmonary evaluation given his history of smoking. Clinically euvolemic and  no sequela's of heart failure.  On the coronary calcium score he was noted to have incidental finding of the liver likely a hepatic cyst per report.  I have asked him to discuss this further and possible work-up with PCP as well.  FINAL MEDICATION LIST END OF ENCOUNTER: Meds ordered this encounter  Medications   amLODipine (NORVASC) 10 MG tablet    Sig: Take 1 tablet (10 mg total) by mouth daily at 10 pm.    Dispense:  90 tablet    Refill:  1    Medications Discontinued During This Encounter  Medication Reason   amLODipine (NORVASC) 5 MG tablet Dose change     Current Outpatient Medications:    amLODipine (NORVASC) 10 MG tablet, Take 1 tablet (10 mg total) by mouth daily at 10 pm., Disp: 90 tablet, Rfl: 1   aspirin 81 MG tablet, Take 81 mg by mouth daily., Disp: , Rfl:    hydrochlorothiazide (HYDRODIURIL) 25 MG tablet, Take 1 tablet (25 mg total) by mouth every morning., Disp: 90 tablet, Rfl: 0   nitroGLYCERIN (NITROSTAT) 0.4 MG SL tablet, Place under the tongue., Disp: , Rfl:    rosuvastatin (CRESTOR) 10 MG tablet, Take 10 mg by mouth at bedtime., Disp: , Rfl:   No orders of the defined types were placed in this encounter.   There are no Patient Instructions on file for this visit.   --Continue cardiac medications as reconciled in final medication list. --Return in about 1 year (around 03/02/2023) for Annual Follow up . or sooner if needed. --Continue follow-up with your primary care physician regarding the management of your other chronic comorbid conditions.  Patient's questions and concerns were addressed to his satisfaction. He voices understanding of the instructions provided during this encounter.   This note was created using a voice recognition software as a result there may be grammatical errors inadvertently enclosed that do not reflect the nature of this encounter. Every attempt is made to correct such errors.  Tessa Lerner, [place], [facility]  Pager: [phone] Office:  [phone]

## 2022-03-20 NOTE — Progress Notes (Signed)
Patient returned our call he said he has not heard from his pcp I told him to try to get in contact with them.

## 2022-03-20 NOTE — Progress Notes (Signed)
Called pt no answer and could not leave a vm 

## 2022-03-22 ENCOUNTER — Other Ambulatory Visit: Payer: 59

## 2022-04-09 ENCOUNTER — Other Ambulatory Visit: Payer: Self-pay | Admitting: Family Medicine

## 2022-04-09 DIAGNOSIS — Z720 Tobacco use: Secondary | ICD-10-CM

## 2022-04-18 ENCOUNTER — Ambulatory Visit
Admission: RE | Admit: 2022-04-18 | Discharge: 2022-04-18 | Disposition: A | Discharge status: Home / Self Care | Payer: 59 | Source: Ambulatory Visit | Attending: Family Medicine | Admitting: Family Medicine

## 2022-04-18 DIAGNOSIS — Z720 Tobacco use: Secondary | ICD-10-CM

## 2022-09-17 IMAGING — CR DG HUMERUS 2V *R*
2 series · 2 of 2 positions shown · non-contrast
Comparison: None.

CLINICAL DATA: Right shoulder pain for 2 months

EXAM:
RIGHT HUMERUS - 2+ VIEW

[w humerus ap right]
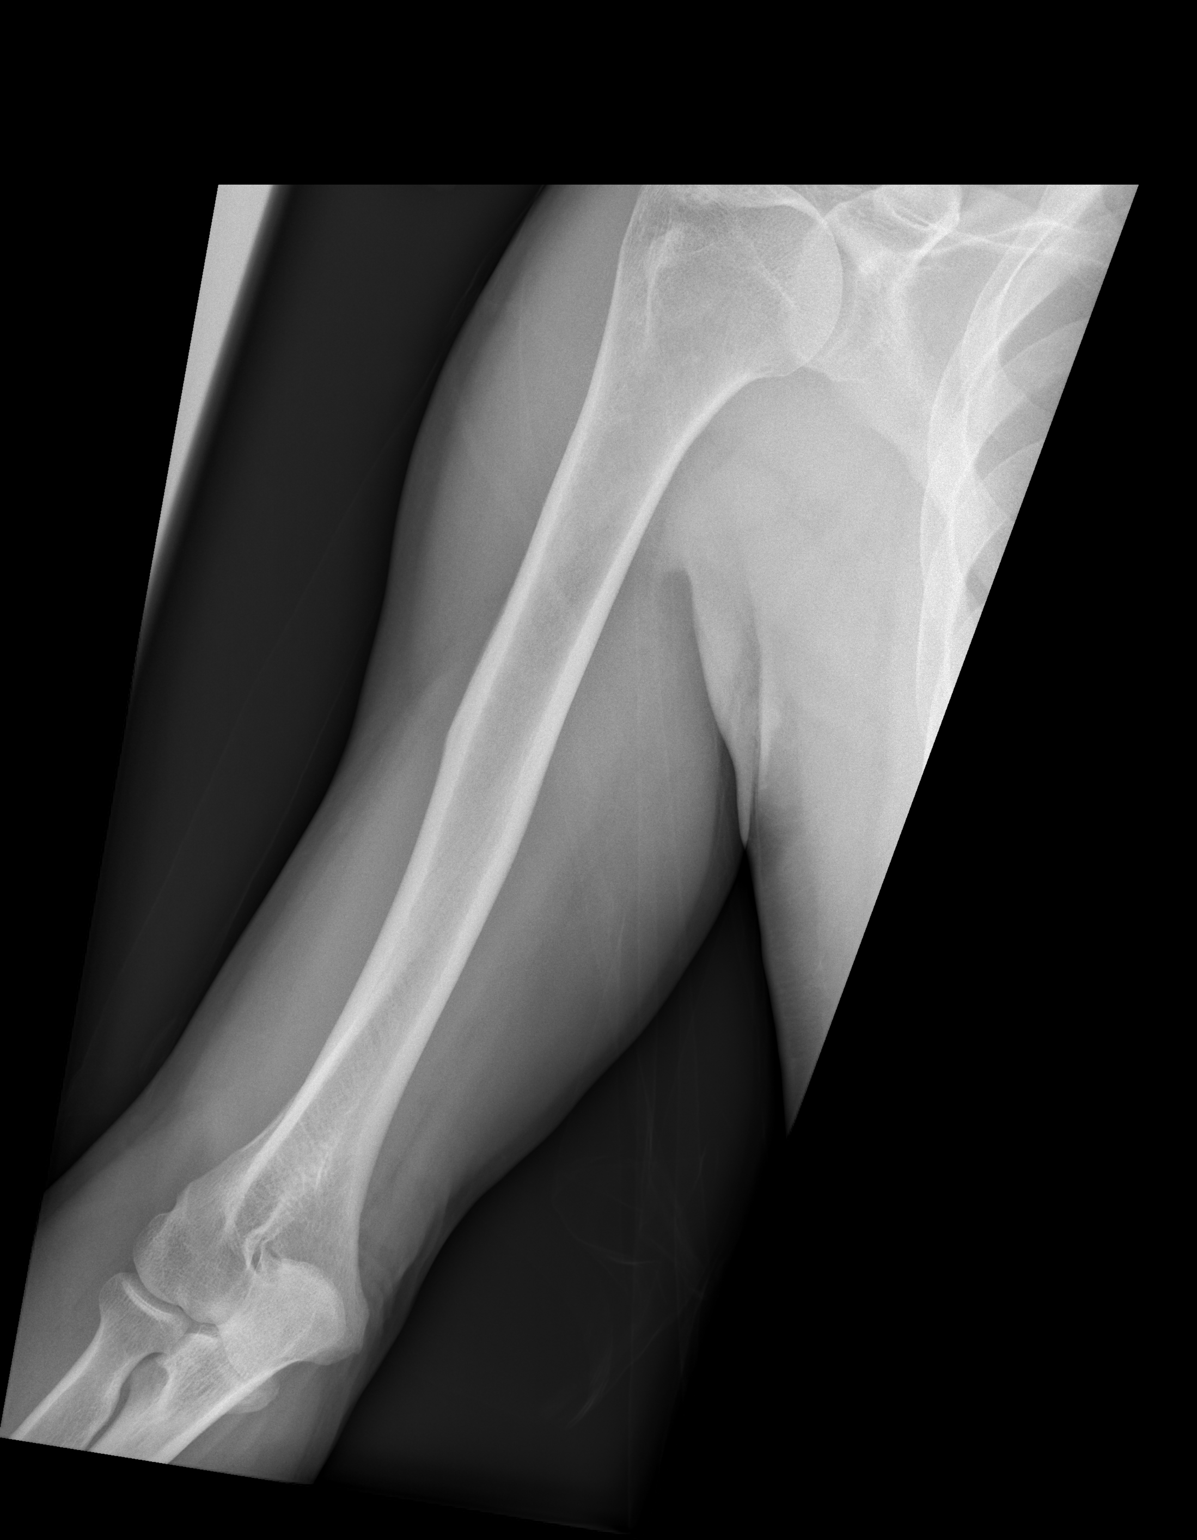

[w humerus lat right]
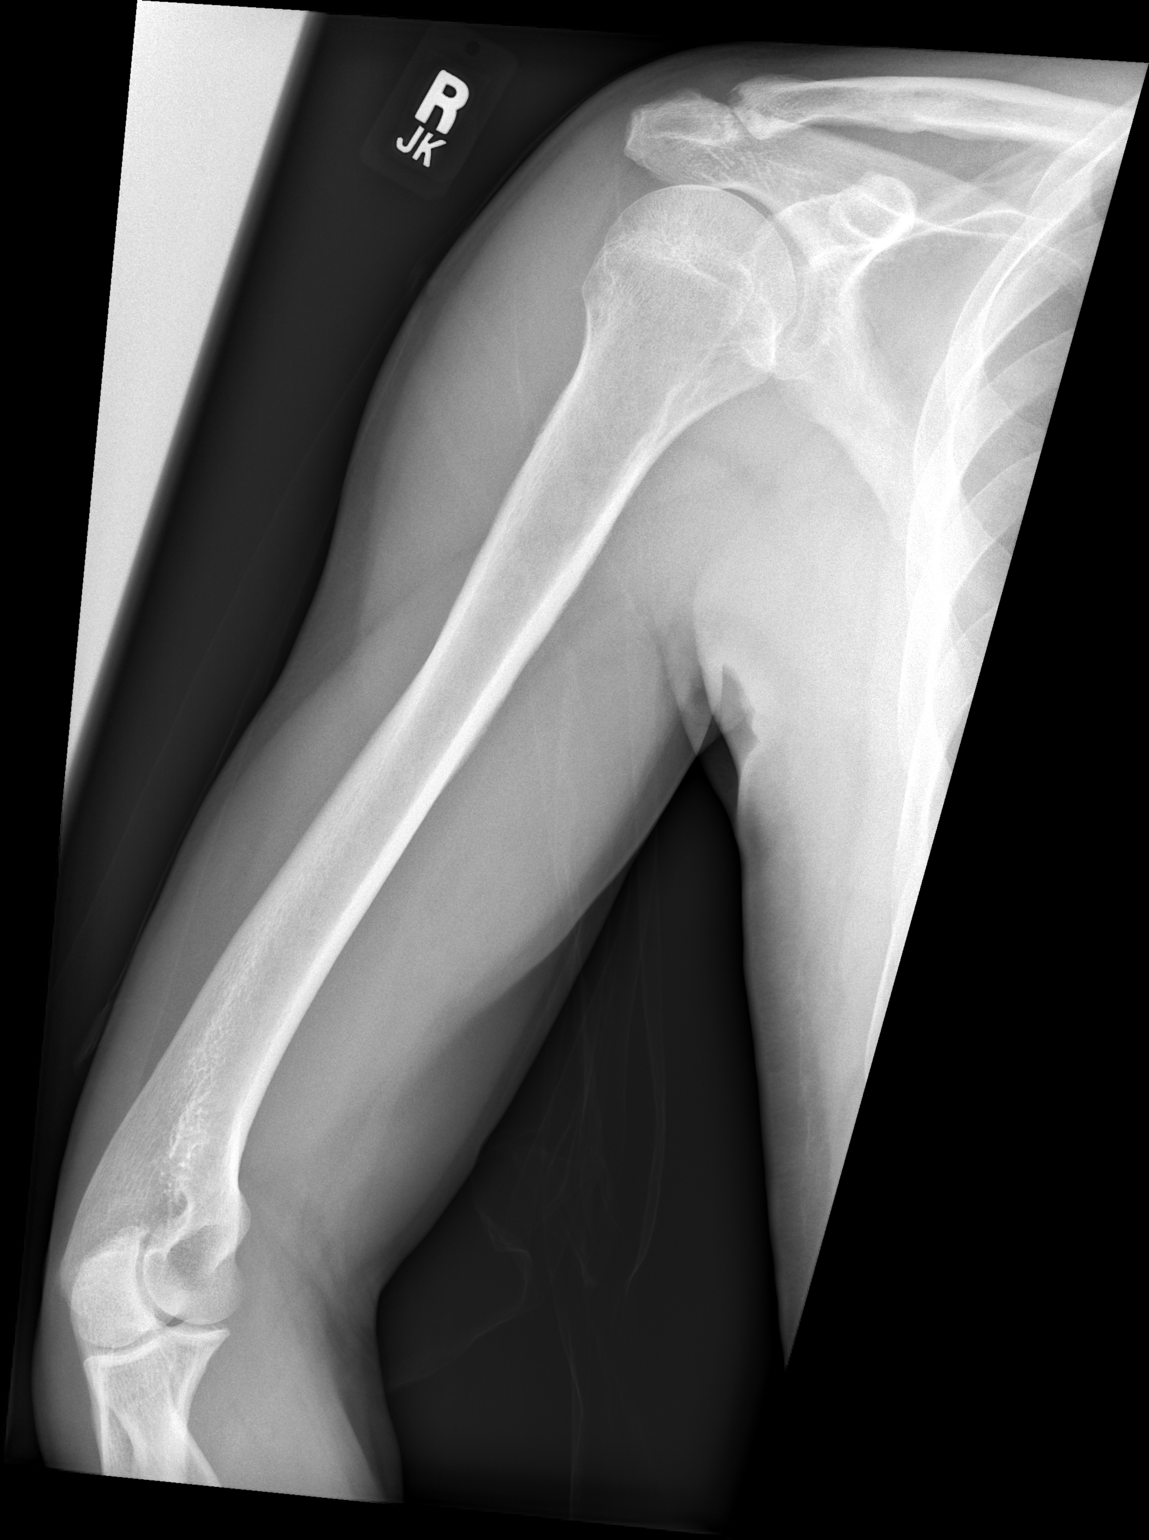

[2 of 2 positions shown; findings below may reference images not displayed]

FINDINGS: Frontal and lateral views of the right humerus demonstrate no
fractures. Alignment is anatomic. Moderate hypertrophic changes of
the acromioclavicular joint. Glenohumeral joint is well preserved.
The right elbow is unremarkable. Soft tissues are normal.
IMPRESSION: 1. Moderate acromioclavicular joint osteoarthritis.
2. Otherwise unremarkable right humerus.

## 2023-03-04 ENCOUNTER — Ambulatory Visit: Payer: Self-pay | Admitting: Cardiology

## 2023-04-09 ENCOUNTER — Other Ambulatory Visit: Payer: Self-pay | Admitting: Cardiology

## 2023-04-09 DIAGNOSIS — I10 Essential (primary) hypertension: Secondary | ICD-10-CM

## 2023-06-10 DIAGNOSIS — J439 Emphysema, unspecified: Secondary | ICD-10-CM | POA: Diagnosis not present

## 2023-06-10 DIAGNOSIS — R634 Abnormal weight loss: Secondary | ICD-10-CM | POA: Diagnosis not present

## 2023-06-10 DIAGNOSIS — F5221 Male erectile disorder: Secondary | ICD-10-CM | POA: Diagnosis not present

## 2023-06-10 DIAGNOSIS — Z720 Tobacco use: Secondary | ICD-10-CM | POA: Diagnosis not present

## 2023-06-10 DIAGNOSIS — I700 Atherosclerosis of aorta: Secondary | ICD-10-CM | POA: Diagnosis not present

## 2023-06-10 DIAGNOSIS — E782 Mixed hyperlipidemia: Secondary | ICD-10-CM | POA: Diagnosis not present

## 2023-06-10 DIAGNOSIS — I10 Essential (primary) hypertension: Secondary | ICD-10-CM | POA: Diagnosis not present

## 2023-08-07 IMAGING — CT CT HEAD W/O CM
4 series · 16 of 47 positions shown, 18 images · non-contrast
Comparison: None.

CLINICAL DATA: Fall from ladder.  Trauma to head.

EXAM:
CT HEAD WITHOUT CONTRAST
TECHNIQUE: Contiguous axial images were obtained from the base of the skull
through the vertex without intravenous contrast.

[Series 2: head 5.00 hr40 s3 axial ibhc · axial · 0.47mm/px · z∈[-677,-552]mm · 6 of 35 slices shown, 8 images]
[im 5/35  brain]
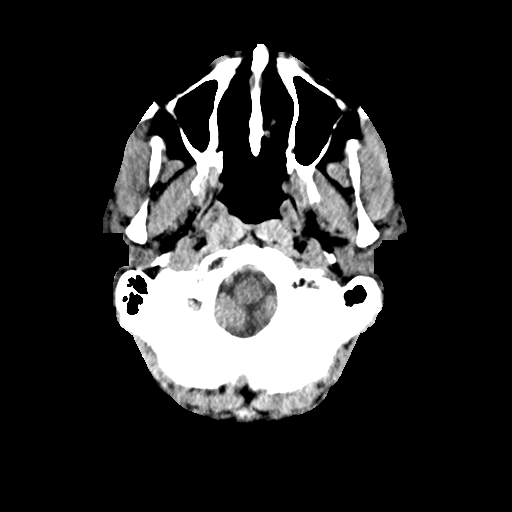
[im 5/35  bone]
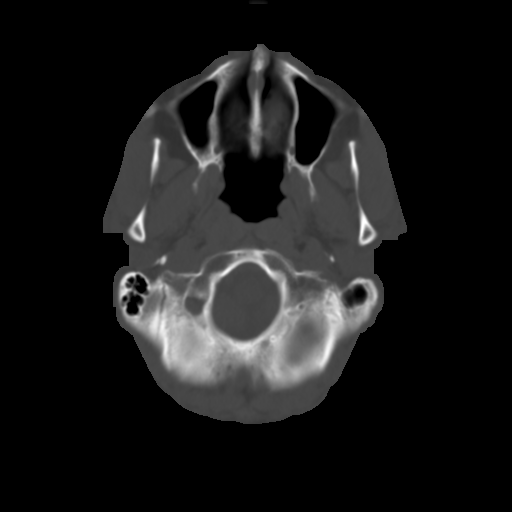
[im 10/35  brain]
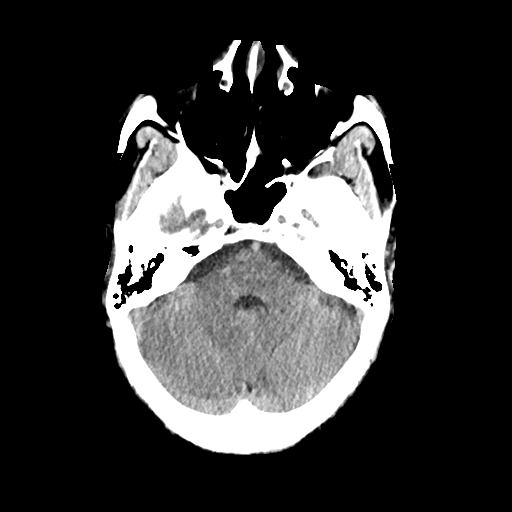
[im 15/35  brain]
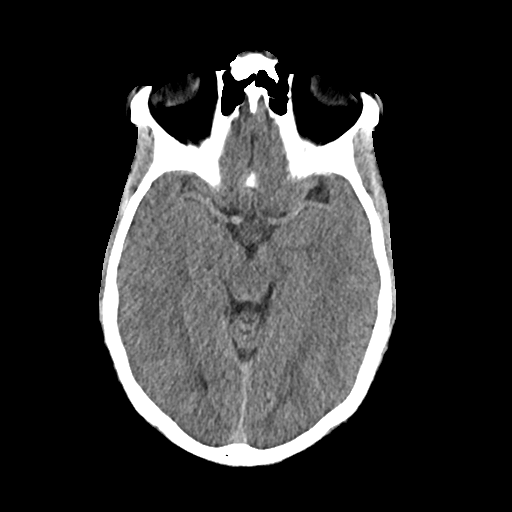
[im 20/35  brain]
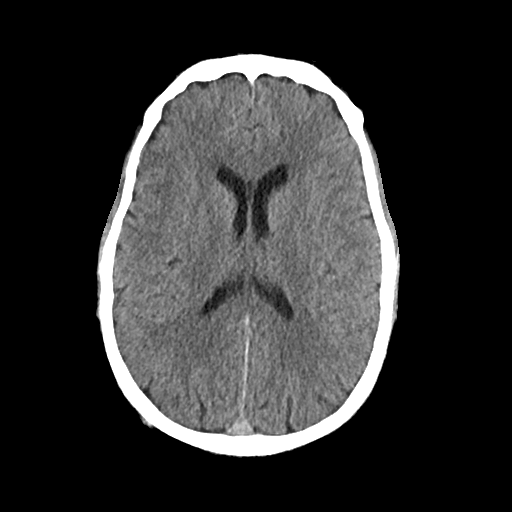
[im 25/35  brain]
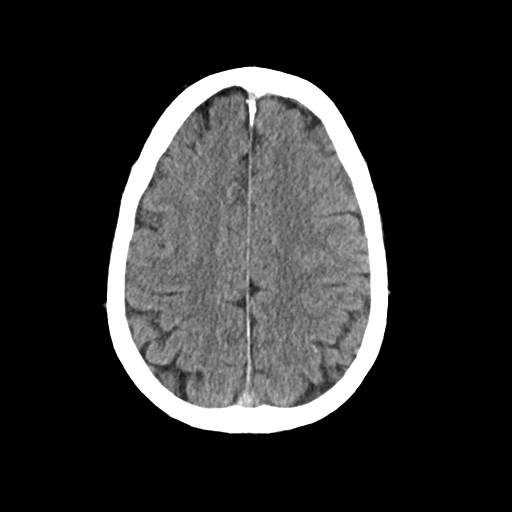
[im 25/35  bone]
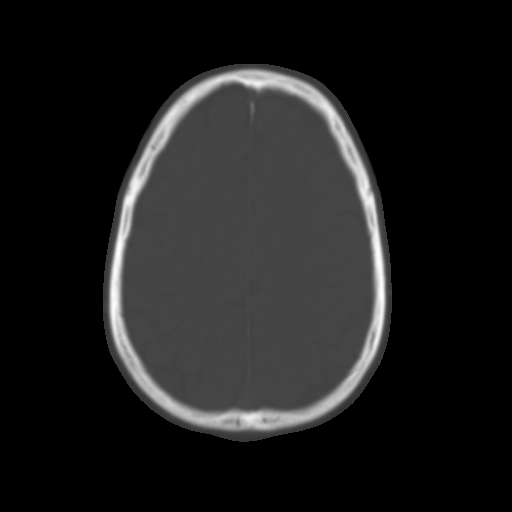
[im 30/35  brain]
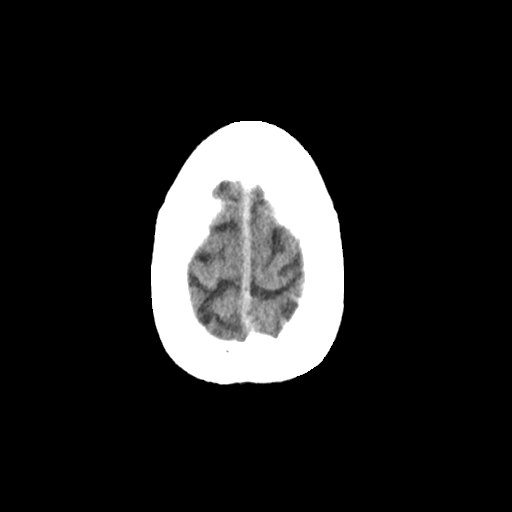

[Series 3: head 2.00 hr60 s3 axial bone · axial · 0.47mm/px · z∈[-682,-624]mm · 4 of 89 slices shown]
[im 9/89  bone]
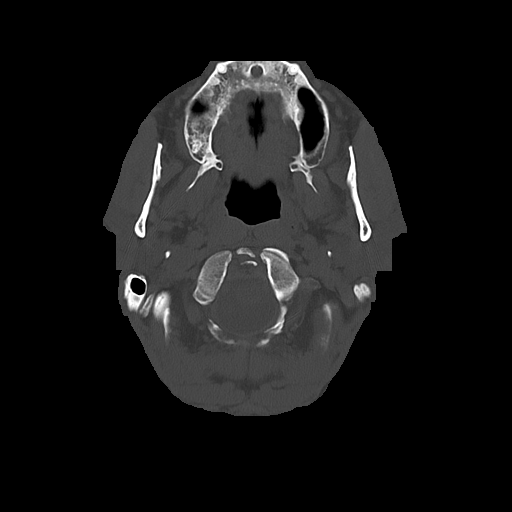
[im 17/89  bone]
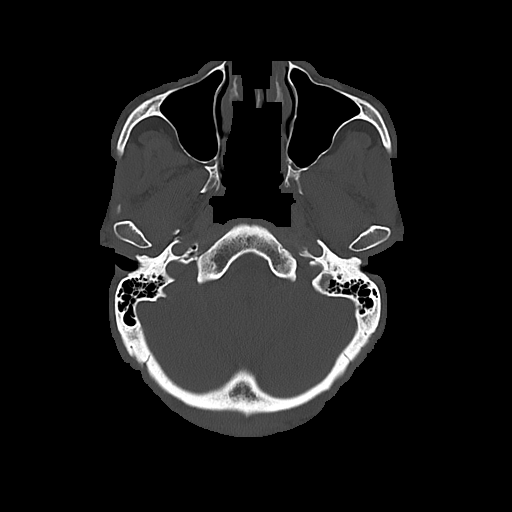
[im 30/89  bone]
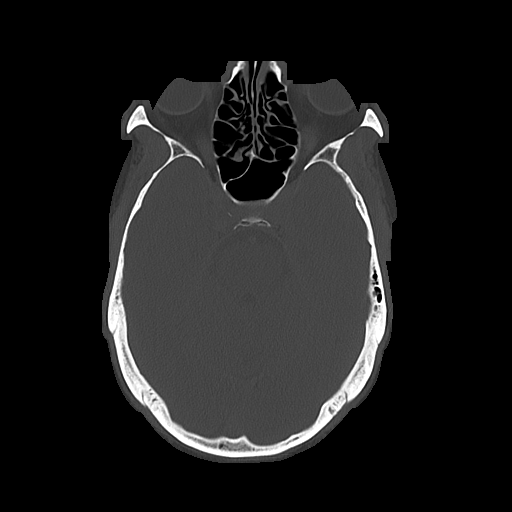
[im 38/89  bone]
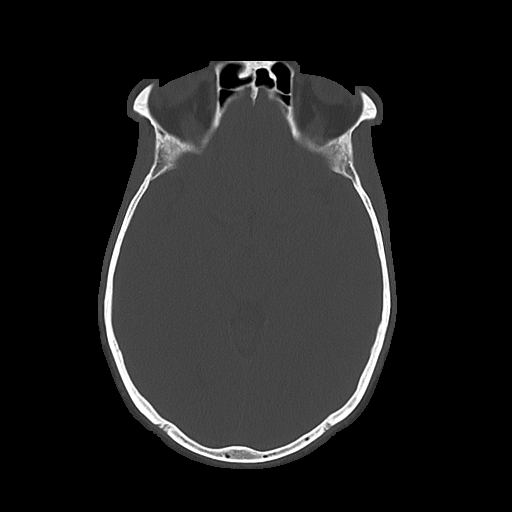

[Series 4: head 3.00 hr40 s3 sag · sagittal · 0.35mm/px · 3 of 80 slices shown]
[im 27/80  brain]
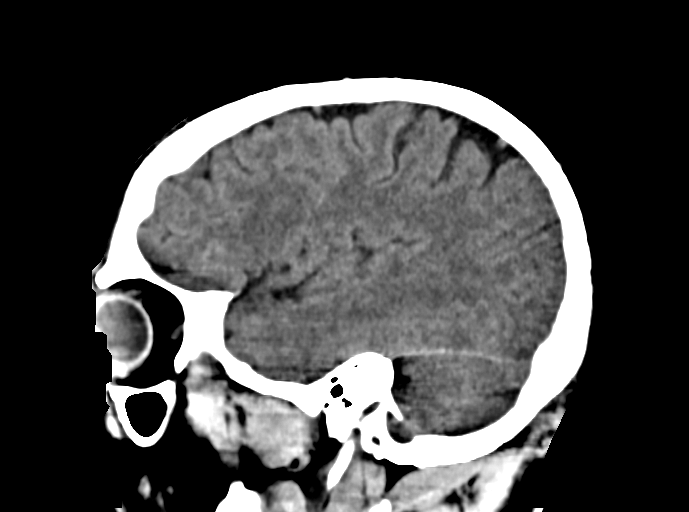
[im 40/80  brain]
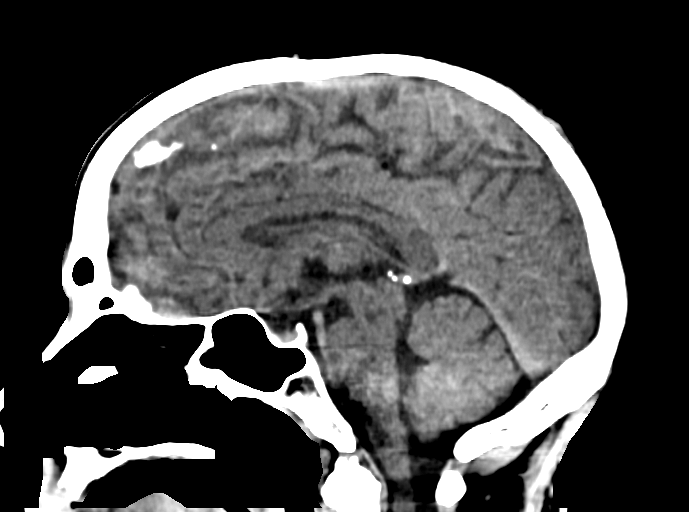
[im 53/80  brain]
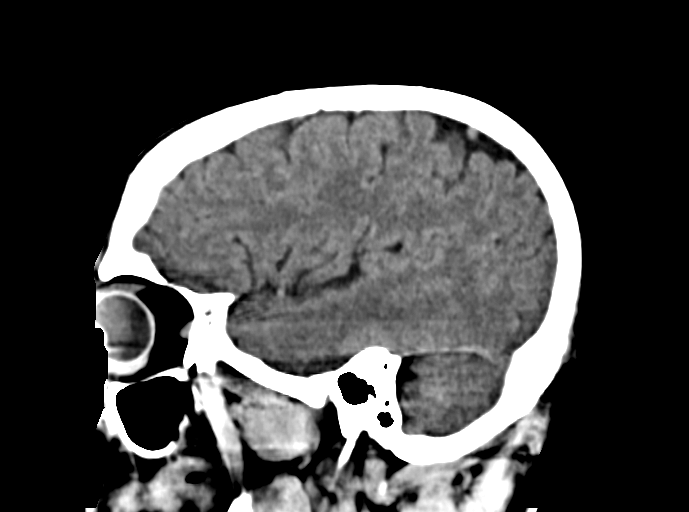

[Series 6: head 3.00 hr40 s3 cor · coronal · 0.35mm/px · 3 of 80 slices shown]
[im 27/80  brain]
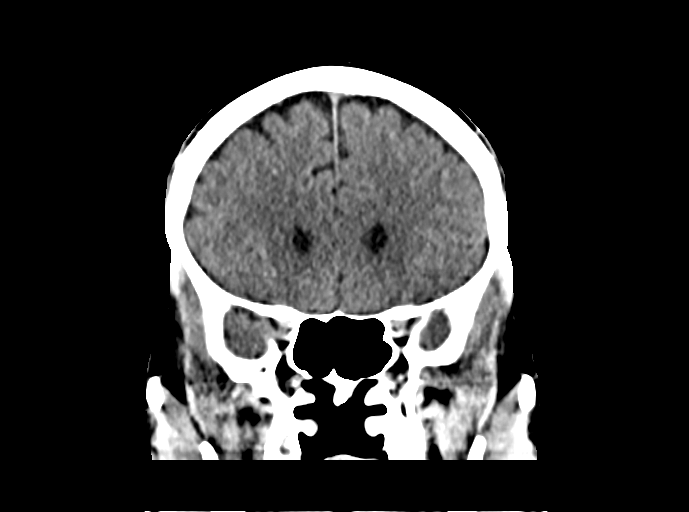
[im 36/80  brain]
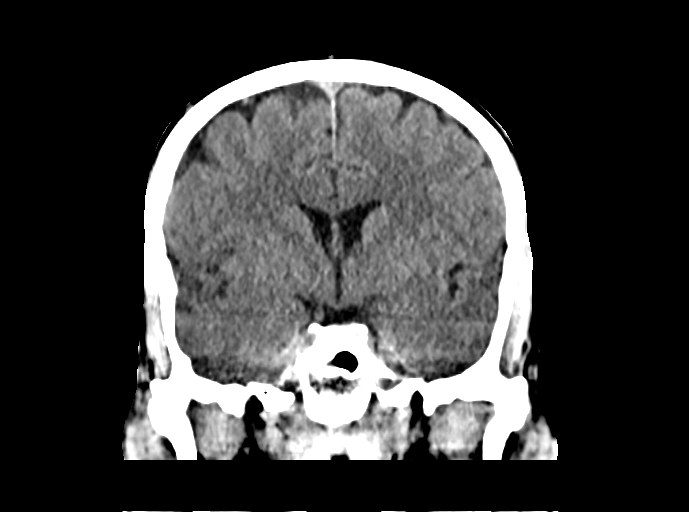
[im 44/80  brain]
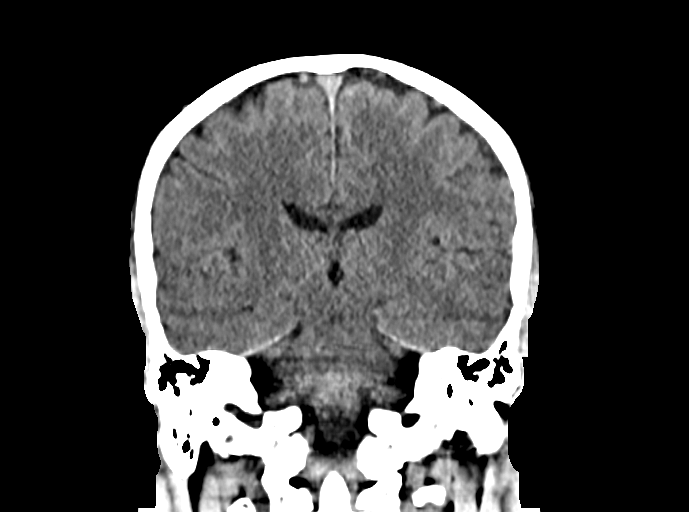

[16 of 47 positions shown; findings below may reference images not displayed]

FINDINGS: Brain: No acute infarct, hemorrhage, or mass lesion is present. The
ventricles are of normal size. No significant extraaxial fluid
collection is present. The brainstem and cerebellum are within
normal limits.

Vascular: No hyperdense vessel or unexpected calcification.

Skull: Calvarium is intact. No focal lytic or blastic lesions are
present. No significant extracranial soft tissue lesion is present.

Sinuses/Orbits: The paranasal sinuses and mastoid air cells are
clear. The globes and orbits are within normal limits.
IMPRESSION: Negative CT of the head.

## 2024-03-16 DIAGNOSIS — Z6824 Body mass index (BMI) 24.0-24.9, adult: Secondary | ICD-10-CM | POA: Diagnosis not present

## 2024-03-16 DIAGNOSIS — Z720 Tobacco use: Secondary | ICD-10-CM | POA: Diagnosis not present

## 2024-03-16 DIAGNOSIS — J439 Emphysema, unspecified: Secondary | ICD-10-CM | POA: Diagnosis not present

## 2024-03-16 DIAGNOSIS — Z6379 Other stressful life events affecting family and household: Secondary | ICD-10-CM | POA: Diagnosis not present

## 2024-03-16 DIAGNOSIS — Z0000 Encounter for general adult medical examination without abnormal findings: Secondary | ICD-10-CM | POA: Diagnosis not present

## 2024-03-16 DIAGNOSIS — I10 Essential (primary) hypertension: Secondary | ICD-10-CM | POA: Diagnosis not present

## 2024-03-16 DIAGNOSIS — E7800 Pure hypercholesterolemia, unspecified: Secondary | ICD-10-CM | POA: Diagnosis not present
